# Patient Record
Sex: Female | Born: 1965 | Marital: Married | Smoking: Never smoker
Health system: Southern US, Community
[De-identification: ages and names within clinical notes are randomized; demographics above are authoritative.]

## PROBLEM LIST (undated history)

## (undated) HISTORY — PX: LEG SURGERY: SHX1003

## (undated) HISTORY — PX: HEMORRHOID SURGERY: SHX153

---

## 2016-11-03 DIAGNOSIS — Z139 Encounter for screening, unspecified: Secondary | ICD-10-CM

## 2016-11-03 LAB — GLUCOSE, POCT (MANUAL RESULT ENTRY): POC Glucose: 96 mg/dl (ref 70–99)

## 2016-11-03 NOTE — Congregational Nurse Program (Unsigned)
Congregational Nurse Program Note  Date of Encounter: 11/03/2016  Past Medical History: No past medical history on file.  Encounter Details:     CNP Questionnaire - 11/03/16 1350      Patient Demographics   Is this a new or existing patient? New   Patient is considered a/an Immigrant   Race Other  [place]     Patient Assistance   Location of Patient Assistance [facility]   Patient's financial/insurance status Low Income;Self-Pay (Uninsured)   Uninsured Patient ([place] Card/Care Connects) Yes   Interventions Assisted patient in making appt.   Patient referred to apply for the following financial assistance Not Applicable   Food insecurities addressed Not Applicable   Transportation assistance No   Assistance securing medications No   Educational health offerings Navigating the healthcare system     Encounter Details   Primary purpose of visit Acute Illness/Condition Visit;Navigating the Healthcare System   Was an Emergency Department visit averted? No   Does patient have a medical provider? No   [facility]   Was a mental health screening completed? (GAINS tool) No   Does patient have dental issues? Yes   Was a dental referral made? No resources for a referral   Does patient have vision issues? No   Does your patient have an abnormal blood pressure today? No   Since previous encounter, have you referred patient for abnormal blood pressure that resulted in a new diagnosis or medication change? No   Does your patient have an abnormal blood glucose today? No   Since previous encounter, have you referred patient for abnormal blood glucose that resulted in a new diagnosis or medication change? No   Was there a life-saving intervention made? No     [facility] alert and oriented to person, time and place and is accompanied by her son. Pt understood questions but couldn't voice her answers as much. Stratus  interpreting was used with the patient. Preferred language is Hindi.  Pt chief complaint for today was a toothache that started about 5 days ago and right hip pain that has lasted about a month.   Pt states [facility]  [facility].   Pt is currently taking Centrum Silver MVI and ibuprofen for the pain.  Pt says she lives at home by herself, and is a widow, and is using her savings to make ends meet. Pt states she does not have medical insurance and is unemployed. Recently moved from [place] to [place] the beginning of March 2018.   Pt states she is receiving food stamps.   Pt surgical history is hip surgery done in [place] roughly 10 years ago, and Piles surgery done in [place] 6 years ago.   Pt states she has headache and believes they are due to her toothache.   Pt denies any eye problems, chest pain, stomach issues and bladder or bowel problems at this time.   Pt states she had some burning and tingling of hands and feet. Also states she has symptoms of depression and anxiety however she states she had never been diagnosed and is not seeking mental health at this time.   Pt voices that she does not smoke, drink or use drugs.  Vitals: Blood pressure:113/80  Pulse: 64  Temp: 97.9 oral   Wt: 125.6  Height: 5'2  Glucose: 96 non fasting  Pt [facility] and LPN secured an appointment for Tuesday November 09, 2014 at 10 am.   [place] Vista[place]anca R. Tiara Bartoli LPN

## 2016-11-08 ENCOUNTER — Ambulatory Visit (HOSPITAL_COMMUNITY)
Admission: RE | Admit: 2016-11-08 | Discharge: 2016-11-08 | Disposition: A | Discharge status: Home / Self Care | Payer: Self-pay | Source: Ambulatory Visit | Attending: Physician Assistant | Admitting: Physician Assistant

## 2016-11-08 ENCOUNTER — Encounter: Payer: Self-pay | Admitting: Physician Assistant

## 2016-11-08 ENCOUNTER — Ambulatory Visit (HOSPITAL_COMMUNITY): Admission: RE | Admit: 2016-11-08 | Payer: Self-pay | Source: Ambulatory Visit

## 2016-11-08 ENCOUNTER — Ambulatory Visit: Payer: Self-pay | Admitting: Physician Assistant

## 2016-11-08 VITALS — BP 100/60 | HR 81 | Temp 97.7°F | Ht 62.0 in | Wt 127.0 lb

## 2016-11-08 DIAGNOSIS — M1611 Unilateral primary osteoarthritis, right hip: Secondary | ICD-10-CM | POA: Insufficient documentation

## 2016-11-08 DIAGNOSIS — M25551 Pain in right hip: Secondary | ICD-10-CM

## 2016-11-08 DIAGNOSIS — Z8781 Personal history of (healed) traumatic fracture: Secondary | ICD-10-CM

## 2016-11-08 DIAGNOSIS — W19XXXA Unspecified fall, initial encounter: Secondary | ICD-10-CM

## 2016-11-08 DIAGNOSIS — K0889 Other specified disorders of teeth and supporting structures: Secondary | ICD-10-CM

## 2016-11-08 DIAGNOSIS — Z9889 Other specified postprocedural states: Secondary | ICD-10-CM

## 2016-11-08 MED ORDER — AMOXICILLIN 500 MG PO CAPS: 500.0000 mg | ORAL_CAPSULE | Freq: Three times a day (TID) | ORAL | 0 refills | Status: DC

## 2016-11-08 NOTE — Progress Notes (Signed)
BP 100/60 (BP Location: Left Arm, Patient Position: Sitting, Cuff Size: Normal)   Pulse 81   Temp 97.7 F (36.5 C)   Ht 5\' 2"  (1.575 m)   Wt 127 lb (57.6 kg)   SpO2 99%   BMI 23.23 kg/m    Subjective:    Patient ID: Lynn Schultz, female    DOB: 1965-11-18, 51 y.o.   MRN: [id number]  HPI: Lynn Schultz is a 51 y.o. female presenting on 11/08/2016 for New Patient (Initial Visit); Back Pain (low mid back pain. pt states she feel from 3-4 stairs); and Dental Pain   HPI   Chief Complaint  Patient presents with  . New Patient (Initial Visit)  . Back Pain    low mid back pain. pt states she feel from 3-4 stairs  . Dental Pain     Pt has applied for medicaid but hasn't heard on that yet  Pt moved here in march of the year from [place], [place]  Pt's adult son assists with language.  His name is Maryln Gottron  In 2007 pt had R femur surgery- ORIF with rod placement.   Pt fell down 4 or 5 stairs  Last week and now she is having pain.  Pt says she landed on her bottom.     Pt had mammogram sometime in  2017 in [place], [place]  Pt complains of R lower toothache.  She had problems with this sometime last year and it improved with antibiotics.   Relevant past medical, surgical, family and social history reviewed and updated as indicated. Interim medical history since our last visit reviewed. Allergies and medications reviewed and updated.  Review of Systems  Constitutional: Negative for appetite change, chills, diaphoresis, fatigue, fever and unexpected weight change.  HENT: Positive for dental problem and ear pain. Negative for congestion, drooling, facial swelling, hearing loss, mouth sores, sneezing, sore throat, trouble swallowing and voice change.   Eyes: Negative for pain, discharge, redness, itching and visual disturbance.  Respiratory: Negative for cough, choking, shortness of breath and wheezing.   Cardiovascular: Negative for chest pain, palpitations and leg swelling.   Gastrointestinal: Negative for abdominal pain, blood in stool, constipation, diarrhea and vomiting.  Endocrine: Negative for cold intolerance, heat intolerance and polydipsia.  Genitourinary: Negative for decreased urine volume, dysuria and hematuria.  Musculoskeletal: Positive for back pain. Negative for arthralgias and gait problem.  Skin: Negative for rash.  Allergic/Immunologic: Negative for environmental allergies.  Neurological: Negative for seizures, syncope, light-headedness and headaches.  Hematological: Negative for adenopathy.  Psychiatric/Behavioral: Negative for agitation, dysphoric mood and suicidal ideas. The patient is not nervous/anxious.     Per HPI unless specifically indicated above     Objective:    BP 100/60 (BP Location: Left Arm, Patient Position: Sitting, Cuff Size: Normal)   Pulse 81   Temp 97.7 F (36.5 C)   Ht 5\' 2"  (1.575 m)   Wt 127 lb (57.6 kg)   SpO2 99%   BMI 23.23 kg/m   Wt Readings from Last 3 Encounters:  11/08/16 127 lb (57.6 kg)  11/03/16 125 lb 9.6 oz (57 kg)    Physical Exam  Constitutional: She is oriented to person, place, and time. She appears well-developed and well-nourished.  HENT:  Head: Normocephalic and atraumatic.  Mouth/Throat: Uvula is midline and oropharynx is clear and moist. No trismus in the jaw. Abnormal dentition. Dental caries present. No dental abscesses or uvula swelling. No oropharyngeal exudate.    Tenderness at decayed tooth. No abscess  seen.   Eyes: Pupils are equal, round, and reactive to light. Conjunctivae and EOM are normal.  Neck: Neck supple. No thyromegaly present.  Cardiovascular: Normal rate and regular rhythm.   Pulmonary/Chest: Effort normal and breath sounds normal.  Abdominal: Soft. Bowel sounds are normal. She exhibits no mass. There is no hepatosplenomegaly. There is no tenderness.  Musculoskeletal: She exhibits no edema.       Right hip: She exhibits decreased range of motion and tenderness.  She exhibits normal strength.       Lumbar back: She exhibits tenderness. She exhibits normal range of motion.  Tenderness low lumbar area.  Tender R hip area.  No point tenderness.   Lymphadenopathy:    She has no cervical adenopathy.  Neurological: She is alert and oriented to person, place, and time. Gait normal.  Skin: Skin is warm and dry.  Psychiatric: She has a normal mood and affect. Her behavior is normal.  Vitals reviewed.   Results for orders placed or performed in visit on 11/03/16  POCT glucose  Result Value Ref Range   POC Glucose 96 70 - 99 mg/dl      Assessment & Plan:    Encounter Diagnoses  Name Primary?  . Fall, initial encounter Yes  . Right hip pain   . Dentalgia   . S/P ORIF (open reduction internal fixation) fracture     -rx amoxil.  Will put pt on dental list -record request sent to [place] labs, mammogram and PAP and colonoscopy reports -discussed with pt unlikely damage to previous Rod placement but she requests xray to make sure it looks good.  Recommend ice, ibu -pt to follow up in one month. RTO sooner prn

## 2016-11-09 ENCOUNTER — Other Ambulatory Visit: Payer: Self-pay | Admitting: Physician Assistant

## 2016-11-09 DIAGNOSIS — W19XXXA Unspecified fall, initial encounter: Secondary | ICD-10-CM

## 2016-11-09 DIAGNOSIS — R9389 Abnormal findings on diagnostic imaging of other specified body structures: Secondary | ICD-10-CM

## 2016-11-10 ENCOUNTER — Telehealth: Payer: Self-pay

## 2016-11-10 NOTE — Telephone Encounter (Signed)
  [facility] August 2nd, 2018 for toothache and hip pain. Was assisted in Hindi language using stratus interpreting. Was also scheduled [facility] at 10 am.   Follow phone call was made today August 9th,2018 and was able to speak to patients emergency contact, her son, Maryln Gottronir. Maryln Gottronir states his mother is doing ok and that and X-ray was done showing decreased  blood flow and is on his was to get the [facility] discount application so his mother can soon be seen by specialist. The patient's son was very appreciative of the follow-up phone call.   Catcher Dehoyos R. Jayston Trevino LPN [phone]

## 2016-11-15 ENCOUNTER — Telehealth: Payer: Self-pay | Admitting: Orthopedic Surgery

## 2016-11-15 NOTE — Telephone Encounter (Signed)
Called back to patient (5:21p.m) to notify that we are unable to offer appointment. No voice message available.  Entering note into referral Workqueue.

## 2016-11-15 NOTE — Telephone Encounter (Signed)
no

## 2016-11-15 NOTE — Telephone Encounter (Signed)
Please review - Referral from Jacquelin HawkingShannon McElroy, PA, [facility] for [facility]/right leg, post fall. (SEE NOTES and Imaging done at [facility].  Patient has history of previous surgery w/hardware, [place] in [place].  Patient/son states there was mention by Ms. McElroy of "blood flow".  Please advise if to schedule with our office or of any other recommendation.  Patient's [phone]#[phone]

## 2016-11-23 ENCOUNTER — Telehealth: Payer: Self-pay | Admitting: Student

## 2016-11-23 NOTE — Telephone Encounter (Signed)
Called pt back 11-23-16 since no appointment has been scheduled yet. LPN spoke with patient's son, Maryln Gottron and notified him that it is very important that the patient gets scheduled to be seen due to possible health risks. Nir verbalized understanding and stated he will call right now to schedule appointment.

## 2016-11-23 NOTE — Telephone Encounter (Signed)
While checking on patient's urgent referral to orthopedic, LPN noticed referral note from Carley Hammed on 11-22-16 at 9:21 AM stating she "attempted to call patient, pt's husband will call back later today to schedule her appointment"  Pt has no appointment scheduled.  LPN called pt and spoke with patient's son, Maryln Gottron. Maryln Gottron states he did get a call from Ortho while he was at work. Maryln Gottron states he will call ortho back when he gets off work to schedule.

## 2016-12-13 ENCOUNTER — Ambulatory Visit: Payer: Self-pay | Admitting: Physician Assistant

## 2016-12-13 ENCOUNTER — Encounter: Payer: Self-pay | Admitting: Physician Assistant

## 2016-12-13 VITALS — BP 100/58 | HR 70 | Temp 97.7°F | Ht 62.0 in | Wt 128.2 lb

## 2016-12-13 DIAGNOSIS — Z8781 Personal history of (healed) traumatic fracture: Secondary | ICD-10-CM

## 2016-12-13 DIAGNOSIS — R9389 Abnormal findings on diagnostic imaging of other specified body structures: Secondary | ICD-10-CM

## 2016-12-13 DIAGNOSIS — Z9889 Other specified postprocedural states: Secondary | ICD-10-CM | POA: Insufficient documentation

## 2016-12-13 NOTE — Patient Instructions (Addendum)
Financial [phone]  [facility] (bone doctor) [address] [place] [address] [phone]

## 2016-12-13 NOTE — Progress Notes (Signed)
BP (!) 100/58 (BP Location: Left Arm, Patient Position: Sitting, Cuff Size: Normal)   Pulse 70   Temp 97.7 F (36.5 C) (Other (Comment))   Ht 5\' 2"  (1.575 m)   Wt 128 lb 4 oz (58.2 kg)   SpO2 99%   BMI 23.46 kg/m    Subjective:    Patient ID: Lynn Schultz, female    DOB: 11-29-1965, 51 y.o.   MRN: [id number]  HPI: Lynn Schultz is a 51 y.o. female presenting on 12/13/2016 for No chief complaint on file.   HPI   Pt's son is with her today to translate  Pt turned in [facility] discount application  Pt has not scheduled appt with orthopedics despite being told how important it is that she see the specialist.  Explained again that it is very important for her to see the specialist.    Pt is having A little bit of pain. Sometimes mores, sometimes less.   Received records from previous PCP.  Labs 10/22/2015 good- cbcd, cmp, lipids, tsh  Relevant past medical, surgical, family and social history reviewed and updated as indicated. Interim medical history since our last visit reviewed. Allergies and medications reviewed and updated.   Current Outpatient Prescriptions:  .  Acetaminophen (TYLENOL PO), Take 1 tablet by mouth as needed., Disp: , Rfl:  .  ibuprofen (ADVIL,MOTRIN) 200 MG tablet, Take 200 mg by mouth every 6 (six) hours as needed., Disp: , Rfl:  .  multivitamin-iron-minerals-folic acid (CENTRUM) chewable tablet, Chew 1 tablet by mouth daily., Disp: , Rfl:    Review of Systems  Constitutional: Negative for appetite change, chills, diaphoresis, fatigue, fever and unexpected weight change.  HENT: Negative for congestion, dental problem, drooling, ear pain, facial swelling, hearing loss, mouth sores, sneezing, sore throat, trouble swallowing and voice change.   Eyes: Negative for pain, discharge, redness, itching and visual disturbance.  Respiratory: Negative for cough, choking, shortness of breath and wheezing.   Cardiovascular: Negative for chest pain, palpitations and  leg swelling.  Gastrointestinal: Negative for abdominal pain, blood in stool, constipation, diarrhea and vomiting.  Endocrine: Negative for cold intolerance, heat intolerance and polydipsia.  Genitourinary: Negative for decreased urine volume, dysuria and hematuria.  Musculoskeletal: Positive for arthralgias and back pain. Negative for gait problem.  Skin: Negative for rash.  Allergic/Immunologic: Negative for environmental allergies.  Neurological: Negative for seizures, syncope, light-headedness and headaches.  Hematological: Negative for adenopathy.  Psychiatric/Behavioral: Negative for agitation, dysphoric mood and suicidal ideas. The patient is not nervous/anxious.     Per HPI unless specifically indicated above     Objective:    BP (!) 100/58 (BP Location: Left Arm, Patient Position: Sitting, Cuff Size: Normal)   Pulse 70   Temp 97.7 F (36.5 C) (Other (Comment))   Ht 5\' 2"  (1.575 m)   Wt 128 lb 4 oz (58.2 kg)   SpO2 99%   BMI 23.46 kg/m   Wt Readings from Last 3 Encounters:  12/13/16 128 lb 4 oz (58.2 kg)  11/08/16 127 lb (57.6 kg)  11/03/16 125 lb 9.6 oz (57 kg)    Physical Exam  Constitutional: She is oriented to person, place, and time. She appears well-developed and well-nourished.  HENT:  Head: Normocephalic and atraumatic.  Neck: Neck supple.  Cardiovascular: Normal rate and regular rhythm.   Pulmonary/Chest: Effort normal and breath sounds normal.  Abdominal: Soft. Bowel sounds are normal. She exhibits no mass. There is no hepatosplenomegaly. There is no tenderness.  Musculoskeletal: She exhibits no edema.  Lymphadenopathy:    She has no cervical adenopathy.  Neurological: She is alert and oriented to person, place, and time.  Skin: Skin is warm and dry.  Psychiatric: She has a normal mood and affect. Her behavior is normal.  Vitals reviewed.       Assessment & Plan:    Encounter Diagnoses  Name Primary?  . Abnormal x-ray Yes  . S/P ORIF (open  reduction internal fixation) fracture     -pt does not need additional labs at this time -will order screening mammogram -pt is given the phone number for the orthopedist and urge to call ASAP for an appointment- xray worrisome for avascular necrosis -pt is given phone number for financial counselor to contact as needed -pt to follow up in 6 weeks to make sure she has seen the orthopedist.  She is to RTO sooner prn

## 2016-12-19 ENCOUNTER — Encounter (INDEPENDENT_AMBULATORY_CARE_PROVIDER_SITE_OTHER): Payer: Self-pay | Admitting: Orthopaedic Surgery

## 2016-12-19 ENCOUNTER — Ambulatory Visit (INDEPENDENT_AMBULATORY_CARE_PROVIDER_SITE_OTHER): Payer: Self-pay | Admitting: Orthopaedic Surgery

## 2016-12-19 ENCOUNTER — Ambulatory Visit (INDEPENDENT_AMBULATORY_CARE_PROVIDER_SITE_OTHER): Payer: Self-pay

## 2016-12-19 DIAGNOSIS — M1611 Unilateral primary osteoarthritis, right hip: Secondary | ICD-10-CM | POA: Insufficient documentation

## 2016-12-19 NOTE — Progress Notes (Signed)
Office Visit Note   Patient: Lynn Schultz           Date of Birth: [birth year]           MRN: [id number] Visit Date: 12/19/2016              Requested by: Jacquelin Hawking, PA-C [address] [place], [place] [address] PCP: Jacquelin Hawking, PA-C   Assessment & Plan: Visit Diagnoses:  1. 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 This is quite advanced x-rays.We had a long discussion regarding the treatment options.  I gave her reading material on total hip replacements. We discussed the risks benefits alternatives to surgery. She and her son will think about this and let us know. I did offer her an intra-articular steroid injection as a way to give her temporary relief but she declined this. Questions encouraged and answered. Follow-up as needed. Total face to face encounter time was greater than 45 minutes and over half of this time was spent in counseling and/or coordination of care.  Follow-Up Instructions: Return if symptoms worsen or fail to improve.   Orders:  Orders Placed This Encounter  Procedures  . XR HIP UNILAT W OR W/O PELVIS 2-3 VIEWS RIGHT   No orders of the defined types were placed in this encounter.     Procedures: No procedures performed   Clinical Data: No additional findings.   Subjective: Chief Complaint  Patient presents with  . Right Hip - Pain    Patient is a 51 year old female who comes in with right hip pain with recent worsening over the last month. She denies any radiation of pain. The pain is localized to her right hip. Denies any numbness and tingling. She walks with a slight limp. She has moderate difficulty with ADLs and decline of quality of life. She had cannulated screw fixation for a presumed right subcapital femoral head fracture about 10 years ago in  [place].    Review of Systems  Constitutional: Negative.   HENT: Negative.   Eyes: Negative.   Respiratory: Negative.   Cardiovascular: Negative.   Endocrine: Negative.   Musculoskeletal: Negative.   Neurological: Negative.   Hematological: Negative.   Psychiatric/Behavioral: Negative.   All other systems reviewed and are negative.    Objective: Vital Signs: There were no vitals taken for this visit.  Physical Exam  Constitutional: She is oriented to person, place, and time. She appears well-developed and well-nourished.  HENT:  Head: Normocephalic and atraumatic.  Eyes: EOM are normal.  Neck: Neck supple.  Pulmonary/Chest: Effort normal.  Abdominal: Soft.  Neurological: She is alert and oriented to person, place, and time.  Skin: Skin is warm. Capillary refill takes less than 2 seconds.  Psychiatric: She has a normal mood and affect. Her behavior is normal. Judgment and thought content normal.  Nursing note and vitals reviewed.   Ortho Exam Right hip exam shows significant discomfort and catching with internal and external rotation. Positive Stinchfield sign. No sciatic tension signs. Lateral hip is mildly tender. Pain refers to the groin region. Specialty Comments:  No specialty comments available.  Imaging: No results found.   PMFS History: Patient Active Problem List   Diagnosis Date Noted  . Primary osteoarthritis of right hip 12/19/2016  . S/P ORIF (open reduction internal fixation) fracture 12/13/2016   No past medical history on file.  Family History  Problem Relation Age of Onset  . Diabetes Mother   . Diabetes Father   . Diabetes Brother     Past Surgical History:  Procedure Laterality Date  . HEMORRHOID SURGERY    . LEG SURGERY Right    fx- rod placed   Social History   Occupational History  . Not on file.   Social History Main Topics  . Smoking status: Never Smoker  . Smokeless tobacco: Never Used  . Alcohol use No  . Drug use: No  .  Sexual activity: Not on file

## 2016-12-28 ENCOUNTER — Encounter: Payer: Self-pay | Admitting: Physician Assistant

## 2017-01-24 ENCOUNTER — Encounter: Payer: Self-pay | Admitting: Physician Assistant

## 2017-01-24 ENCOUNTER — Ambulatory Visit: Payer: Self-pay | Admitting: Physician Assistant

## 2017-01-24 VITALS — BP 102/60 | HR 83 | Temp 97.7°F | Ht 62.0 in | Wt 129.5 lb

## 2017-01-24 DIAGNOSIS — K0889 Other specified disorders of teeth and supporting structures: Secondary | ICD-10-CM

## 2017-01-24 DIAGNOSIS — M1611 Unilateral primary osteoarthritis, right hip: Secondary | ICD-10-CM

## 2017-01-24 NOTE — Progress Notes (Signed)
BP 102/60 (BP Location: Left Arm, Patient Position: Sitting, Cuff Size: Normal)   Pulse 83   Temp 97.7 F (36.5 C)   Ht 5\' 2"  (1.575 m)   Wt 129 lb 8 oz (58.7 kg)   SpO2 98%   BMI 23.69 kg/m    Subjective:    Patient ID: Lynn Schultz, female    DOB: [birth year], 51 y.o.   MRN: [id number]  HPI: Lynn Schultz is a 51 y.o. female presenting on 01/24/2017 for Follow-up   HPI   Pt's son is with her today translating.    Pt has been to orthopedist for her osteonecrosis.  Ortho recommended hip replacement surgery.   Pt says they aren't sure when she wants to do it because pt's daughter-in-law is pregnant.  Discussed reasons for doing it now- so she will be recovered by delivery date, because her [facility] discount is approved now and is only good for 6 months, because ortho stated osteonecrosis appeared advanced on x-ray.    Discussed PAP.  No record of PAP received from previous PCP.  Pt uncertain of her last pap.  She has appointment for her mammogram upcoming.    Pt is doing well otherwise and has no complaints other than the hip.  Relevant past medical, surgical, family and social history reviewed and updated as indicated. Interim medical history since our last visit reviewed. Allergies and medications reviewed and updated.  Current Outpatient Prescriptions:  .  Acetaminophen (TYLENOL PO), Take 1 tablet by mouth as needed., Disp: , Rfl:  .  ibuprofen (ADVIL,MOTRIN) 200 MG tablet, Take 200 mg by mouth every 6 (six) hours as needed., Disp: , Rfl:  .  multivitamin-iron-minerals-folic acid (CENTRUM) chewable tablet, Chew 1 tablet by mouth daily., Disp: , Rfl:    Review of Systems  Constitutional: Negative for appetite change, chills, diaphoresis, fatigue, fever and unexpected weight change.  HENT: Positive for dental problem. Negative for congestion, drooling, ear pain, facial swelling, hearing loss, mouth sores, sneezing, sore throat, trouble swallowing and voice change.   Eyes:  Negative for pain, discharge, redness, itching and visual disturbance.  Respiratory: Negative for cough, choking, shortness of breath and wheezing.   Cardiovascular: Negative for chest pain, palpitations and leg swelling.  Gastrointestinal: Negative for abdominal pain, blood in stool, constipation, diarrhea and vomiting.  Endocrine: Negative for cold intolerance, heat intolerance and polydipsia.  Genitourinary: Negative for decreased urine volume, dysuria and hematuria.  Musculoskeletal: Positive for arthralgias, back pain and gait problem.  Skin: Negative for rash.  Allergic/Immunologic: Negative for environmental allergies.  Neurological: Negative for seizures, syncope, light-headedness and headaches.  Hematological: Negative for adenopathy.  Psychiatric/Behavioral: Negative for agitation, dysphoric mood and suicidal ideas. The patient is not nervous/anxious.     Per HPI unless specifically indicated above     Objective:    BP 102/60 (BP Location: Left Arm, Patient Position: Sitting, Cuff Size: Normal)   Pulse 83   Temp 97.7 F (36.5 C)   Ht 5\' 2"  (1.575 m)   Wt 129 lb 8 oz (58.7 kg)   SpO2 98%   BMI 23.69 kg/m   Wt Readings from Last 3 Encounters:  01/24/17 129 lb 8 oz (58.7 kg)  12/13/16 128 lb 4 oz (58.2 kg)  11/08/16 127 lb (57.6 kg)    Physical Exam  Constitutional: She is oriented to person, place, and time. She appears well-developed and well-nourished.  HENT:  Head: Normocephalic and atraumatic.  Neck: Neck supple.  Cardiovascular: Normal rate and regular rhythm.  Pulmonary/Chest: Effort normal and breath sounds normal.  Abdominal: Soft. Bowel sounds are normal. She exhibits no mass. There is no hepatosplenomegaly. There is no tenderness.  Musculoskeletal: She exhibits no edema.  Lymphadenopathy:    She has no cervical adenopathy.  Neurological: She is alert and oriented to person, place, and time.  Skin: Skin is warm and dry.  Psychiatric: She has a normal  mood and affect. Her behavior is normal.  Vitals reviewed.       Assessment & Plan:    Encounter Diagnoses  Name Primary?  . Primary osteoarthritis of right hip Yes  . Dentalgia      -they will discuss and consider hip surgery timing -they will discuss whether pt would like to update her PAP and let me know -will put pt on dental list -pt to follow up in 6 months.  RTO sooner prn

## 2017-07-25 ENCOUNTER — Ambulatory Visit: Payer: Self-pay | Admitting: Physician Assistant

## 2017-10-08 IMAGING — DX DG FEMUR 2+V*R*
4 series · 4 of 4 positions shown · non-contrast
Comparison: None.

CLINICAL DATA: Right upper leg pain since a fall 3-4 days ago.
Initial encounter.

EXAM:
RIGHT FEMUR 2 VIEWS

[femur ap (1 of 2)]
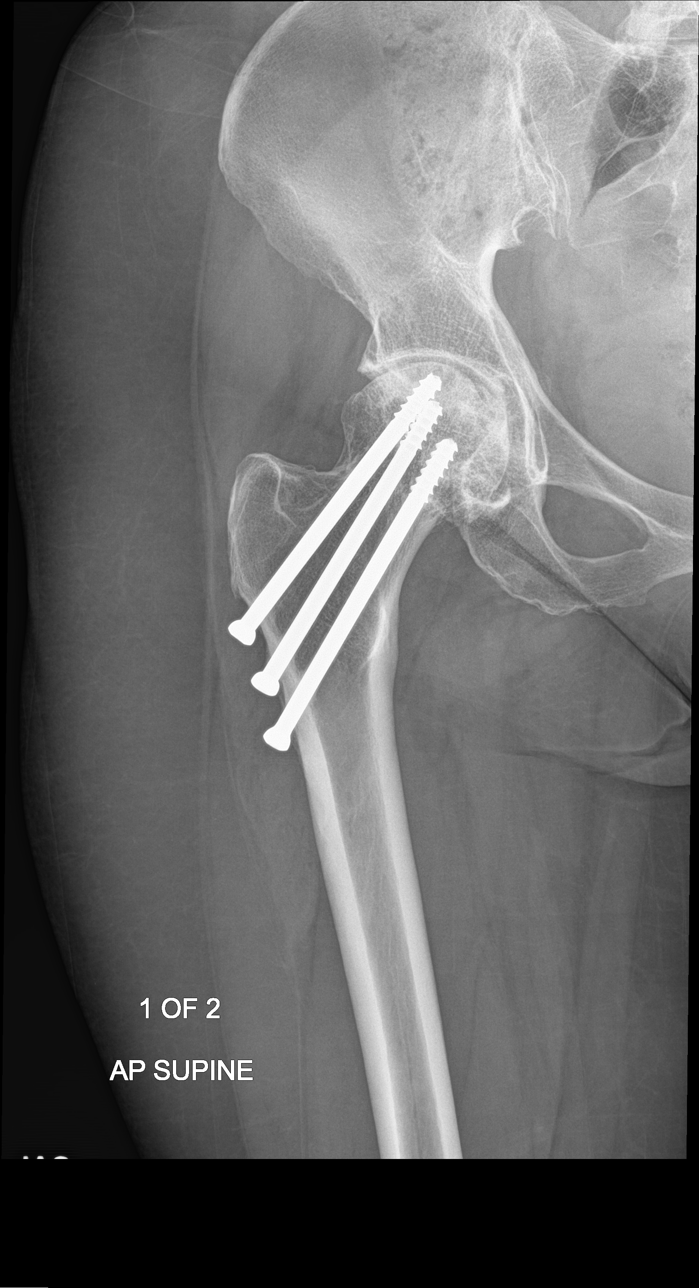

[femur ap (2 of 2)]
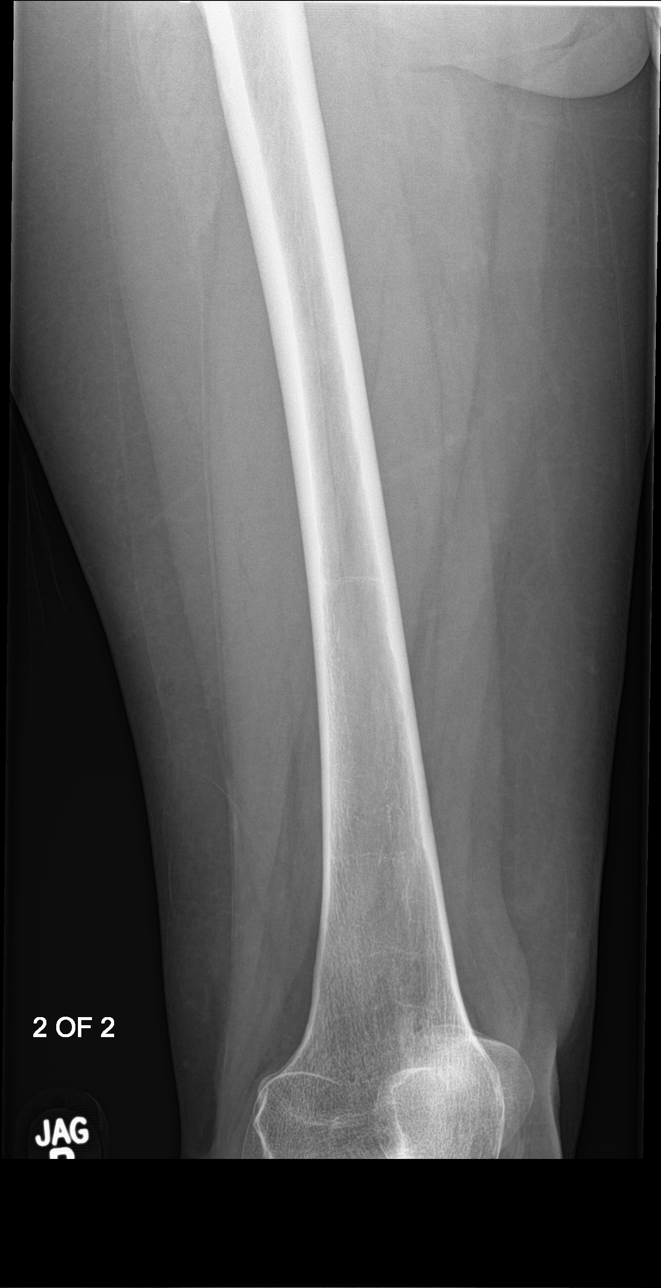

[femur lat (1 of 2)]
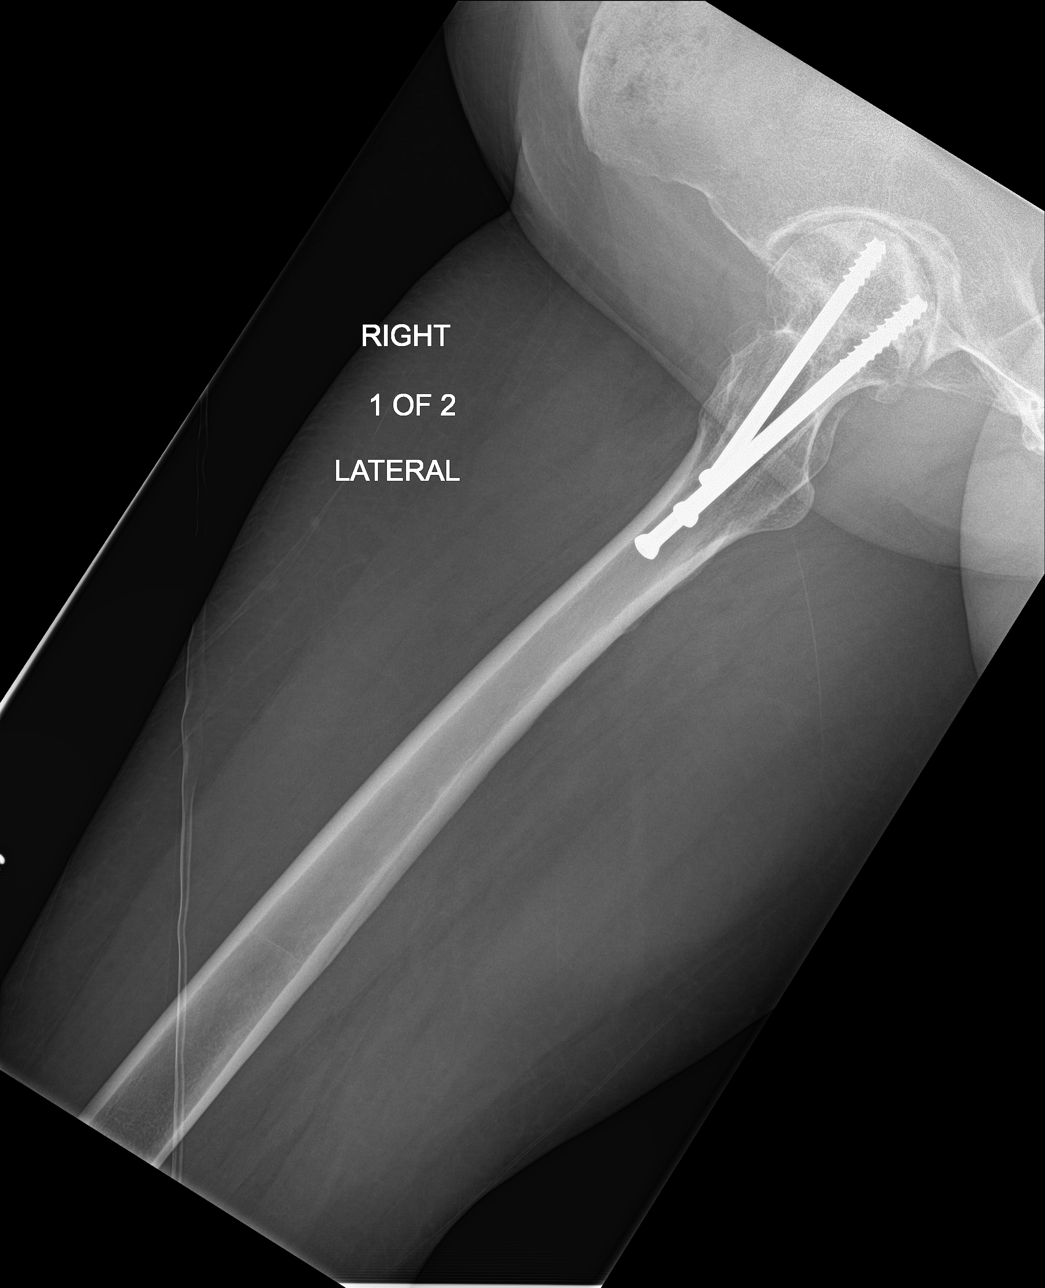

[femur lat (2 of 2)]
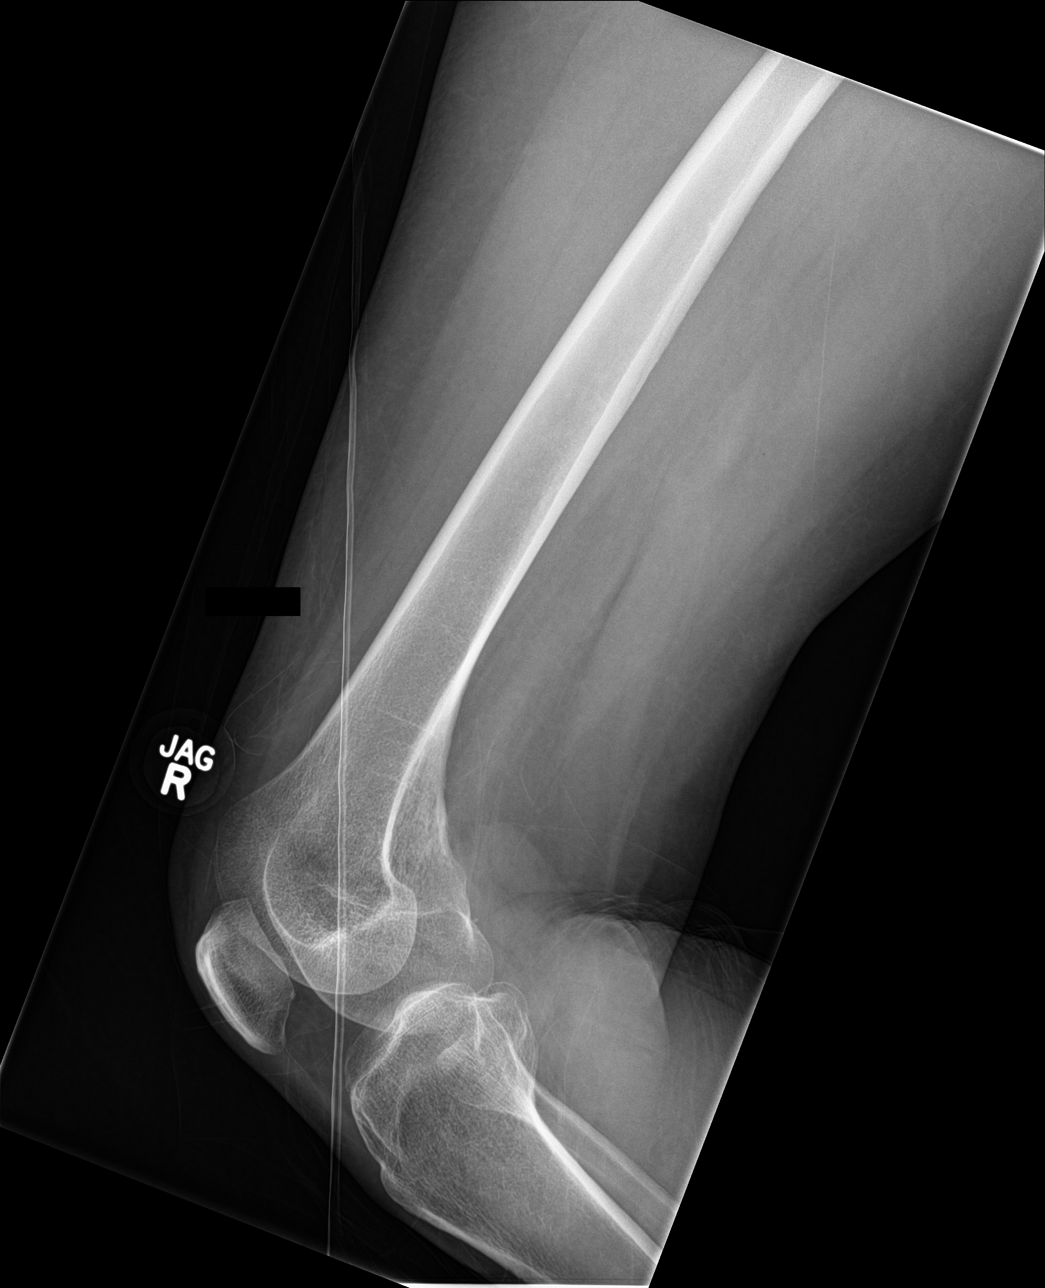

[4 of 4 positions shown; findings below may reference images not displayed]

FINDINGS: The patient is status post fixation of a healed subcapital right hip
fracture with [address] There is moderately severe right
hip osteoarthritis. The right femoral head is flattened and
sclerotic worrisome for avascular necrosis. Imaged bones otherwise
appear normal. The right knee is unremarkable. Soft tissues appear
normal.
IMPRESSION: No acute abnormality.

Healed subcapital right hip fracture with findings worrisome for
avascular necrosis of the femoral head. Moderately severe right hip
osteoarthritis is present.
